# Patient Record
Sex: Female | Born: 1995 | Race: White | Hispanic: No | Marital: Single
Health system: Southern US, Community
[De-identification: ages and names within clinical notes are randomized; demographics above are authoritative.]

---
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
## 2021-04-04 ENCOUNTER — Emergency Department (HOSPITAL_COMMUNITY): Payer: BC Managed Care – PPO

## 2021-04-04 ENCOUNTER — Other Ambulatory Visit: Payer: Self-pay

## 2021-04-04 ENCOUNTER — Emergency Department (HOSPITAL_COMMUNITY)
Admission: EM | Admit: 2021-04-04 | Discharge: 2021-04-05 | Disposition: A | Discharge status: Home / Self Care | Payer: BC Managed Care – PPO | Attending: Emergency Medicine | Admitting: Emergency Medicine

## 2021-04-04 DIAGNOSIS — R441 Visual hallucinations: Secondary | ICD-10-CM | POA: Insufficient documentation

## 2021-04-04 DIAGNOSIS — R002 Palpitations: Secondary | ICD-10-CM | POA: Diagnosis not present

## 2021-04-04 DIAGNOSIS — F419 Anxiety disorder, unspecified: Secondary | ICD-10-CM | POA: Insufficient documentation

## 2021-04-04 DIAGNOSIS — B379 Candidiasis, unspecified: Secondary | ICD-10-CM | POA: Diagnosis not present

## 2021-04-04 DIAGNOSIS — J45909 Unspecified asthma, uncomplicated: Secondary | ICD-10-CM | POA: Diagnosis not present

## 2021-04-04 DIAGNOSIS — R000 Tachycardia, unspecified: Secondary | ICD-10-CM | POA: Diagnosis not present

## 2021-04-04 LAB — CBC WITH DIFFERENTIAL/PLATELET
Abs Immature Granulocytes: 0.03 10*3/uL (ref 0.00–0.07)
Basophils Absolute: 0.1 10*3/uL (ref 0.0–0.1)
Basophils Relative: 1 %
Eosinophils Absolute: 0.1 10*3/uL (ref 0.0–0.5)
Eosinophils Relative: 1 %
HCT: 43.2 % (ref 36.0–46.0)
Hemoglobin: 15.1 g/dL — ABNORMAL HIGH (ref 12.0–15.0)
Immature Granulocytes: 0 %
Lymphocytes Relative: 42 %
Lymphs Abs: 3.5 10*3/uL (ref 0.7–4.0)
MCH: 33.6 pg (ref 26.0–34.0)
MCHC: 35 g/dL (ref 30.0–36.0)
MCV: 96 fL (ref 80.0–100.0)
Monocytes Absolute: 1 10*3/uL (ref 0.1–1.0)
Monocytes Relative: 12 %
Neutro Abs: 3.6 10*3/uL (ref 1.7–7.7)
Neutrophils Relative %: 44 %
Platelets: 374 10*3/uL (ref 150–400)
RBC: 4.5 MIL/uL (ref 3.87–5.11)
RDW: 12.8 % (ref 11.5–15.5)
WBC: 8.3 10*3/uL (ref 4.0–10.5)
nRBC: 0 % (ref 0.0–0.2)

## 2021-04-04 LAB — I-STAT BETA HCG BLOOD, ED (MC, WL, AP ONLY): I-stat hCG, quantitative: <5 m[IU]/mL (ref <5)

## 2021-04-04 LAB — TSH: TSH: 4.408 u[IU]/mL (ref 0.350–4.500)

## 2021-04-04 LAB — COMPREHENSIVE METABOLIC PANEL
ALT: 36 U/L (ref 0–44)
AST: 29 U/L (ref 15–41)
Albumin: 4.6 g/dL (ref 3.5–5.0)
Alkaline Phosphatase: 58 U/L (ref 38–126)
Anion gap: 10 (ref 5–15)
BUN: 5 mg/dL — ABNORMAL LOW (ref 6–20)
CO2: 23 mmol/L (ref 22–32)
Calcium: 9.4 mg/dL (ref 8.9–10.3)
Chloride: 104 mmol/L (ref 98–111)
Creatinine, Ser: 0.69 mg/dL (ref 0.44–1.00)
GFR, Estimated: >60 mL/min (ref >60)
Glucose, Bld: 95 mg/dL (ref 70–99)
Potassium: 3.1 mmol/L — ABNORMAL LOW (ref 3.5–5.1)
Sodium: 137 mmol/L (ref 135–145)
Total Bilirubin: 0.7 mg/dL (ref 0.3–1.2)
Total Protein: 8 g/dL (ref 6.5–8.1)

## 2021-04-04 LAB — RAPID URINE DRUG SCREEN, HOSP PERFORMED
Amphetamines: NOT DETECTED
Barbiturates: NOT DETECTED
Benzodiazepines: NOT DETECTED
Cocaine: NOT DETECTED
Opiates: NOT DETECTED
Tetrahydrocannabinol: NOT DETECTED

## 2021-04-04 LAB — ETHANOL: Alcohol, Ethyl (B): <10 mg/dL (ref <10)

## 2021-04-04 MED ORDER — FLUCONAZOLE 200 MG PO TABS
200.0000 mg | ORAL_TABLET | Freq: Every day | ORAL | 0 refills | Status: AC
Start: 2021-04-04 — End: 2021-04-09

## 2021-04-04 MED ORDER — SODIUM CHLORIDE 0.9 % IV BOLUS
1000.0000 mL | Freq: Once | INTRAVENOUS | Status: AC
  Administered 2021-04-04: 1000 mL via INTRAVENOUS

## 2021-04-04 MED ORDER — FLUCONAZOLE 150 MG PO TABS
150.0000 mg | ORAL_TABLET | Freq: Once | ORAL | Status: AC
  Administered 2021-04-04: 150 mg via ORAL
  Filled 2021-04-04: qty 1

## 2021-04-04 NOTE — Discharge Instructions (Addendum)
You were seen today for concerns for rash.  You will be treated for yeast infection. ?Also follow-up closely with your counselor/psychiatrist as an outpatient. ?

## 2021-04-04 NOTE — ED Provider Notes (Signed)
? ?Emergency Department Provider Note ? ? ?I have reviewed the triage vital signs and the nursing notes. ? ? ?HISTORY ? ?Chief Complaint ?Anxiety ? ? ?HPI ?April Hendricks is a 26 y.o. female with prior history of thyroid disease, remote history of Ewing sarcoma, asthma, and anxiety presents to the ED with feeling of crawling under her skin.  Patient describes increasing anxiety regarding this and feels that there are "bugs" or small "worms" coming through the skin. When she collects them she finds that they are clumps of fuzz or cat hair but continues to have this uneasy feeling. Denies CP or fever.  She presents today from urgent care where she was initially evaluated and found to have tachycardia with heart rate into the 120s.  She denies feeling short of breath to me.  No fevers.  No chest or abdominal pain.  No change to her medications.  She is compliant with her medicines and follows with a psychiatrist but has not discussed her above symptoms, as they started in the last 48 hours.  ? ? ?No past medical history on file. ? ?Review of Systems ? ?Constitutional: No fever/chills ?Eyes: No visual changes. ?ENT: No sore throat. ?Cardiovascular: Denies chest pain. ?Respiratory: Denies shortness of breath. ?Gastrointestinal: No abdominal pain.  No nausea, no vomiting.  No diarrhea.  No constipation. ?Genitourinary: Negative for dysuria. ?Musculoskeletal: Negative for back pain. ?Skin: Positive subjective rash and feeling of bugs crawling.  ?Neurological: Negative for headaches, focal weakness or numbness. ? ?____________________________________________ ? ? ?PHYSICAL EXAM: ? ?VITAL SIGNS: ?ED Triage Vitals  ?Enc Vitals Group  ?   BP 04/04/21 1920 (!) 150/110  ?   Pulse Rate 04/04/21 1920 (!) 108  ?   Resp 04/04/21 1920 16  ?   Temp 04/04/21 1920 98.5 ?F (36.9 ?C)  ?   Temp Source 04/04/21 1920 Oral  ?   SpO2 04/04/21 1920 100 %  ?   Weight 04/04/21 1921 205 lb (93 kg)  ?   Height 04/04/21 1921 5\' 3"  (1.6 m)   ? ?Constitutional: Alert and oriented. Well appearing and in no acute distress. ?Eyes: Conjunctivae are normal.  ?Head: Atraumatic. ?Nose: No congestion/rhinnorhea. ?Mouth/Throat: Mucous membranes are moist. ?Neck: No stridor.   ?Cardiovascular: Normal rate, regular rhythm. Good peripheral circulation. Grossly normal heart sounds.   ?Respiratory: Normal respiratory effort.  No retractions. Lungs CTAB. ?Gastrointestinal: Soft and nontender. No distention.  ?Musculoskeletal: No lower extremity tenderness nor edema. No gross deformities of extremities. ?Neurologic:  Normal speech and language. No gross focal neurologic deficits are appreciated.  ?Skin:  Skin is warm, dry and intact. No rash noted. ? ?____________________________________________ ?  ?LABS ?(all labs ordered are listed, but only abnormal results are displayed) ? ?Labs Reviewed  ?COMPREHENSIVE METABOLIC PANEL - Abnormal; Notable for the following components:  ?    Result Value  ? Potassium 3.1 (*)   ? BUN 5 (*)   ? All other components within normal limits  ?CBC WITH DIFFERENTIAL/PLATELET - Abnormal; Notable for the following components:  ? Hemoglobin 15.1 (*)   ? All other components within normal limits  ?ETHANOL  ?RAPID URINE DRUG SCREEN, HOSP PERFORMED  ?TSH  ?I-STAT BETA HCG BLOOD, ED ([facility], [facility], [facility] ONLY)  ? ?____________________________________________ ? ?EKG ? ? EKG Interpretation ? ?Date/Time:  Monday April 04 2021 20:45:18 EDT ?Ventricular Rate:  102 ?PR Interval:  149 ?QRS Duration: 100 ?QT Interval:  358 ?QTC Calculation: 467 ?R Axis:   51 ?Text Interpretation: Sinus tachycardia Biatrial  enlargement RSR' in V1 or V2, right VCD or RVH Confirmed by Alona BeneLong, Ivis Nicolson [id number]) on 04/04/2021 9:08:25 PM ?  ? ?  ? ? ?____________________________________________ ? ?RADIOLOGY ? ?DG Chest 2 View ? ?Result Date: 04/04/2021 ?CLINICAL DATA:  Shortness of breath.  Tachycardia EXAM: CHEST - 2 VIEW COMPARISON:  None. FINDINGS: The cardiomediastinal contours are normal.  The lungs are clear. Pulmonary vasculature is normal. No consolidation, pleural effusion, or pneumothorax. No acute osseous abnormalities are seen. IMPRESSION: Negative radiographs of the chest. Electronically Signed   By: Narda RutherfordMelanie  Sanford M.D.   On: 04/04/2021 21:42   ? ?____________________________________________ ? ? ?PROCEDURES ? ?Procedure(s) performed:  ? ?Procedures ? ?None  ?____________________________________________ ? ? ?INITIAL IMPRESSION / ASSESSMENT AND PLAN / ED COURSE ? ?Pertinent labs & imaging results that were available during my care of the patient were reviewed by me and considered in my medical decision making (see chart for details). ?  ?This patient is Presenting for Evaluation of palpitations, which does require a range of treatment options, and is a complaint that involves a high risk of morbidity and mortality. ? ?The Differential Diagnoses include dehydration, electrolyte disturbance, AKI, psych event, thyroid abnormality. ? ?Critical Interventions-  ?  ?Medications  ?fluconazole (DIFLUCAN) tablet 150 mg (has no administration in time range)  ?sodium chloride 0.9 % bolus 1,000 mL (1,000 mLs Intravenous New Bag/Given 04/04/21 2217)  ? ? ?Reassessment after intervention:   ? ? ?I did obtain Additional Historical Information from [facility] at bedside. ? ?I decided to review pertinent External Data, and in [facility] ?  ?Clinical Laboratory Tests Ordered, included no significant leukocytosis or anemia.  Pregnancy test is negative.  TSH is within normal limits.  UDS and alcohol are negative. ? ?Radiologic Tests Ordered, included CXR. I independently interpreted the images and agree with radiology interpretation.  ? ?Cardiac Monitor Tracing which shows mild sinus tachycardia. ? ? ?Social Determinants of Health Risk Denies EtOH or drugs.  ? ?Consult complete with TTS. Evaluation pending for visual hallucinations.  ? ?Medical Decision Making: Summary:  ?Patient presents to  the emergency department for evaluation of elevated heart rate with feeling of crawling under her skin.  She is showing me bits of's and dust which she initially thought were bugs and collected in the bag at bedside.  She denies any suicidal or homicidal ideation.  She is not on any new medications.  Plan for screening blood work, TSH, and IVF.  ? ?Reevaluation with update and discussion with patient.  Plan for fluconazole times 2 doses with some fungal type irritation under the breasts. Patient reports similar area to the groin. Discussed keeping this clean/dry.  Patient continues to describe seen "white lines" under the skin and bugs or other things on her bed sheets and body. Discussed psychiatry evaluation in the ED and patient is in agreement with this. Plan for PO meds and TTS evaluation. Patient is here under voluntary status and medically clear.  ? ?Disposition: pending  ? ?____________________________________________ ? ?FINAL CLINICAL IMPRESSION(S) / ED DIAGNOSES ? ?Final diagnoses:  ?Tachycardia  ?Visual hallucination  ?Candida infection  ? ? ? ?NEW OUTPATIENT MEDICATIONS STARTED DURING THIS VISIT: ? ?New Prescriptions  ? FLUCONAZOLE (DIFLUCAN) 200 MG TABLET    Take 1 tablet (200 mg total) by mouth daily for 5 days.  ? ? ?Note:  This document was prepared using Dragon voice recognition software and may include unintentional dictation errors. ? ?Alona BeneJoshua Jonerik Sliker, MD, FACEP ?Emergency Medicine ? ?  ?  Maia Plan, MD ?04/04/21 2338 ? ?

## 2021-04-04 NOTE — ED Triage Notes (Signed)
Pt BIB EMS from UC with reports of tachycardia, SHOB, and stating that her skin was crawling.  ?

## 2021-04-04 NOTE — ED Provider Triage Note (Signed)
Emergency Medicine Provider Triage Evaluation Note ? 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  Pt brought by ambulance from UC with complaint of feeling her "skin crawling".  Feels it constantly but at times can visualize it.  Believes it may be due to her ADHD medication.  Hx of anxiety, depression, ADHD, and PTSD.  Denies SI/HI.  Hx of cancer.  Hx of eczema. ? ?Review of Systems  ?Positive: As above ?Negative: As above ? ?Physical Exam  ?BP (!) 150/110 (BP Location: Left Arm)   Pulse (!) 108   Temp 98.5 ?F (36.9 ?C) (Oral)   Resp 16   Ht 5\' 3"  (1.6 m)   Wt 93 kg   SpO2 100%   BMI 36.31 kg/m?  ?Gen:   Awake, no distress ?Resp:  Normal effort  ?MSK:   Moves extremities without difficulty  ?Other:  Appears anxious and paranoid.  Clinically appears to be visual hallucinating ? ?Medical Decision Making  ?Medically screening exam initiated at 8:04 PM.  Appropriate orders placed.  April Hendricks was informed that the remainder of the evaluation will be completed by another provider, this initial triage assessment does not replace that evaluation, and the importance of remaining in the ED until their evaluation is complete. ? ?Labs ordered ?  ? , PA-C ?04/04/21 2019 ? ?

## 2021-04-05 NOTE — ED Provider Notes (Signed)
Patient signed out pending TTS evaluation.  Very anxious about skin issues per previous provider.  Did have some evidence of candidal infection but also described pulling things out of her skin and there was some concern for potential delusion.  Patient does not have any SI or HI.  She does not wish to be evaluated by TTS and reports that she will follow-up with her counselor as an outpatient.  She seems to have some insight and is okay with being treated for Candida.  Prescription was sent by prior provider. ?Physical Exam  ?BP 118/80   Pulse 99   Temp 98.5 ?F (36.9 ?C) (Oral)   Resp 18   Ht 1.6 m (5\' 3" )   Wt 93 kg   SpO2 100%   BMI 36.31 kg/m?  ? ?Physical Exam ? ?Procedures  ?Procedures ? ?ED Course / MDM  ?  ?Medical Decision Making ?Amount and/or Complexity of Data Reviewed ?Labs: ordered. ?Radiology: ordered. ? ?Risk ?Prescription drug management. ? ? ?Problem List Items Addressed This Visit   ?None ?Visit Diagnoses   ? ? Tachycardia    -  Primary  ? Visual hallucination      ? Candida infection      ? Relevant Medications  ? fluconazole (DIFLUCAN) tablet 150 mg (Completed)  ? fluconazole (DIFLUCAN) 200 MG tablet  ? ?  ? ? ? ? ? ? ?  ? , MD ?04/05/21 0012 ? ?

## 2023-03-21 IMAGING — CR DG CHEST 2V
2 series · 2 of 2 positions shown · non-contrast
Comparison: None.

CLINICAL DATA: Shortness of breath.  Tachycardia

EXAM:
CHEST - 2 VIEW

[w chest pa]
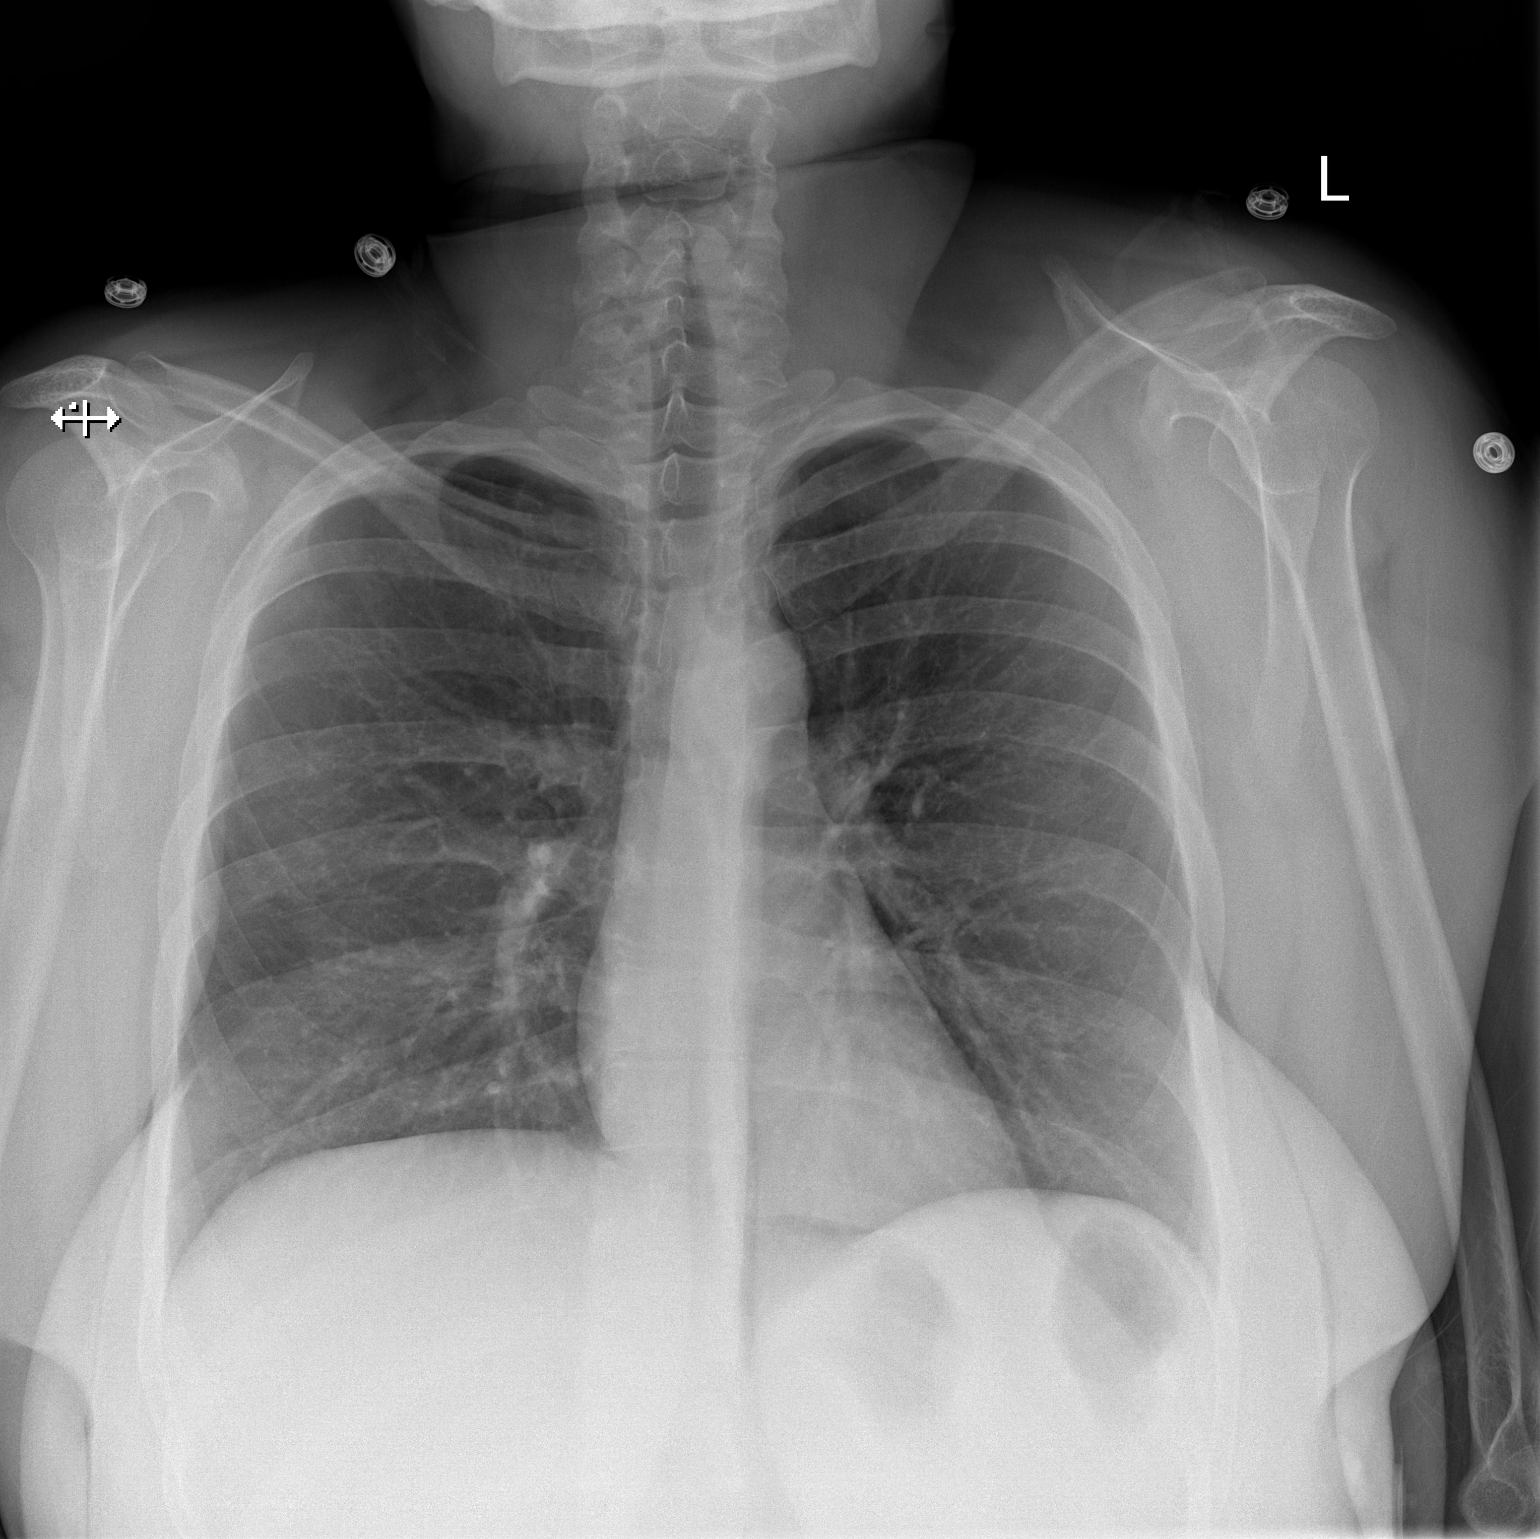

[w chest lat]
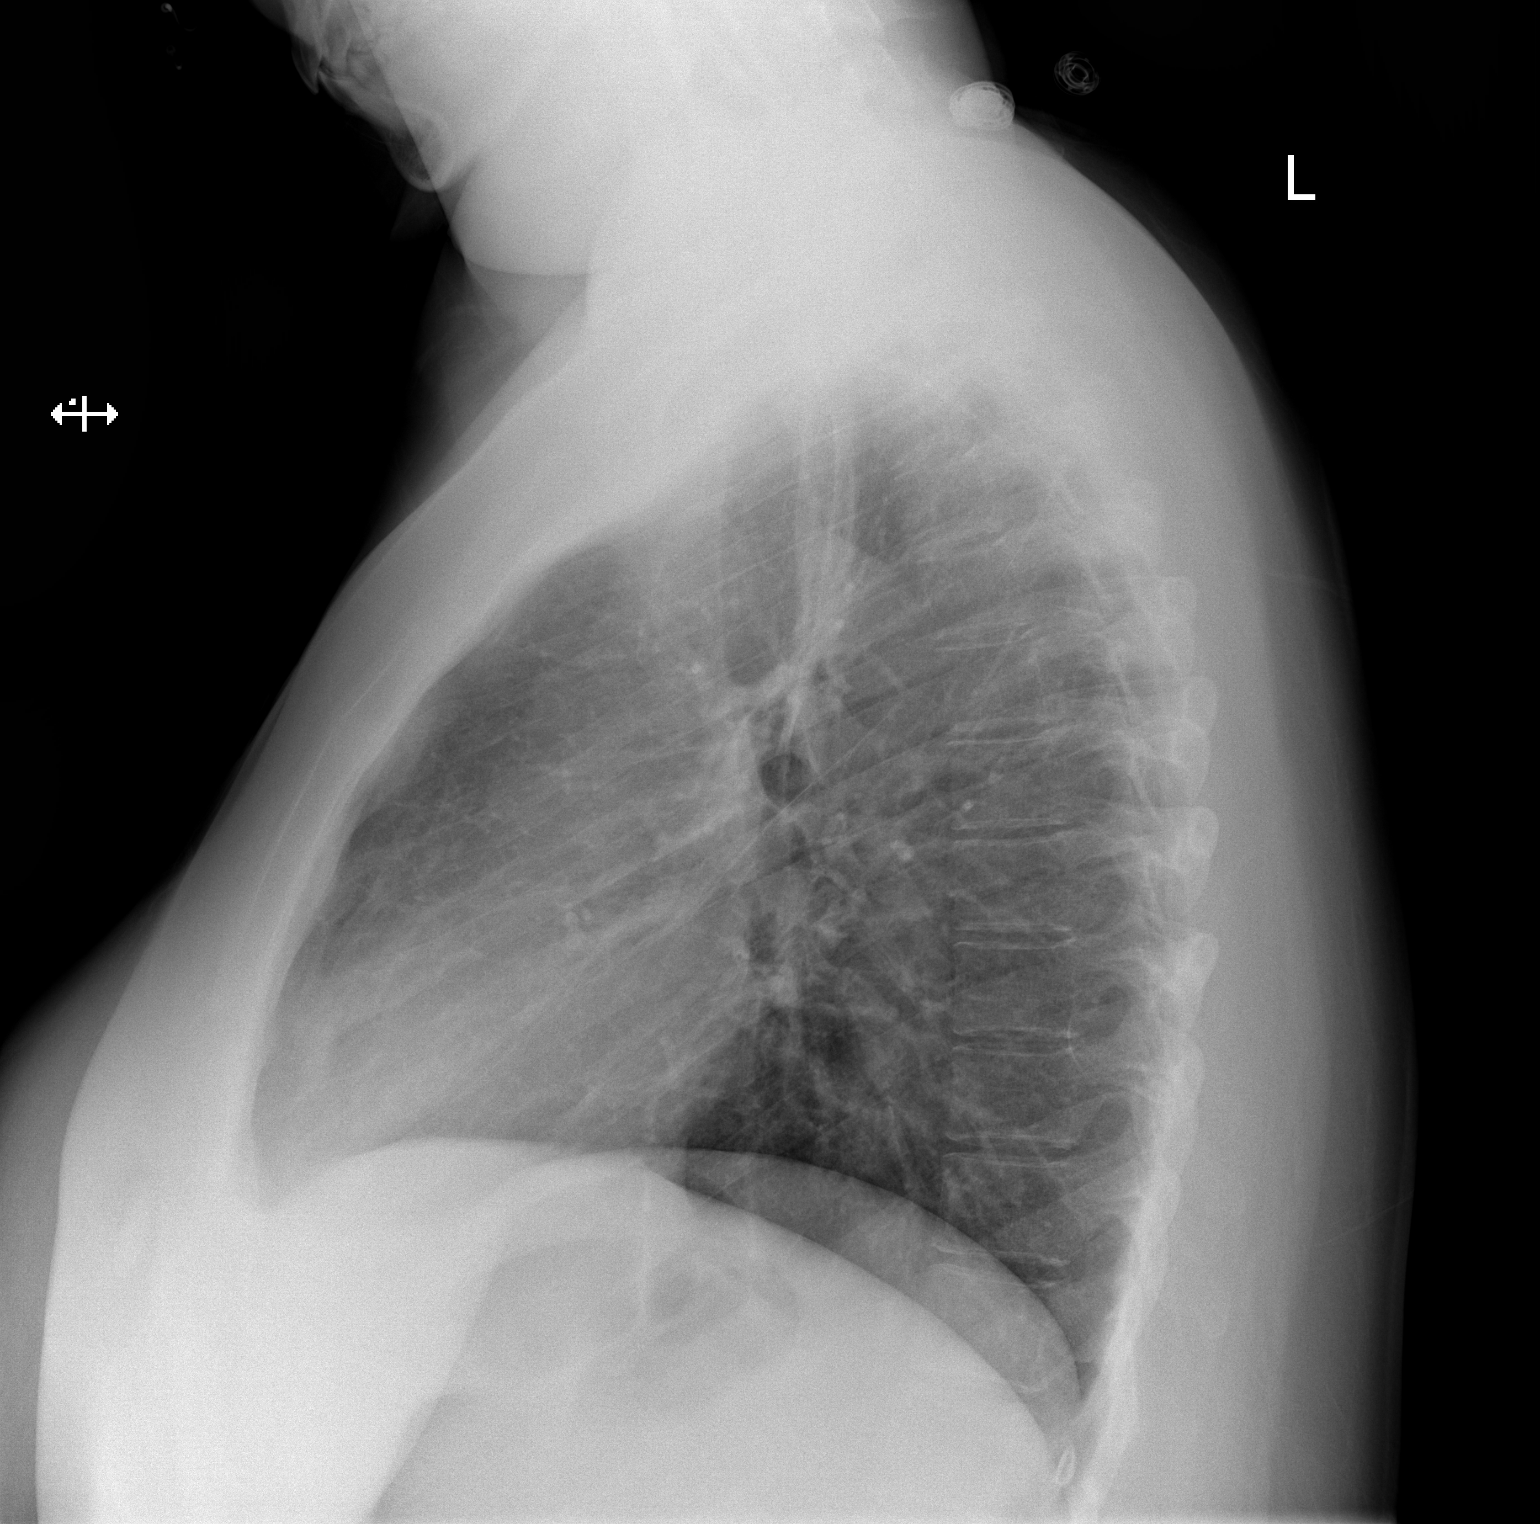

[2 of 2 positions shown; findings below may reference images not displayed]

FINDINGS: The cardiomediastinal contours are normal. The lungs are clear.
Pulmonary vasculature is normal. No consolidation, pleural effusion,
or pneumothorax. No acute osseous abnormalities are seen.
IMPRESSION: Negative radiographs of the chest.
